# Patient Record
Sex: Male | Born: 1960 | Race: White | Hispanic: No | Marital: Married | Smoking: Never smoker
Health system: Southern US, Community
[De-identification: ages and names within clinical notes are randomized; demographics above are authoritative.]

---

## 2011-06-24 ENCOUNTER — Emergency Department: Payer: Self-pay | Admitting: Internal Medicine

## 2017-07-25 ENCOUNTER — Emergency Department: Payer: No Typology Code available for payment source

## 2017-07-25 ENCOUNTER — Emergency Department
Admission: EM | Admit: 2017-07-25 | Discharge: 2017-07-25 | Disposition: A | Discharge status: Home / Self Care | Payer: No Typology Code available for payment source | Attending: Emergency Medicine | Admitting: Emergency Medicine

## 2017-07-25 DIAGNOSIS — S161XXA Strain of muscle, fascia and tendon at neck level, initial encounter: Secondary | ICD-10-CM | POA: Diagnosis not present

## 2017-07-25 DIAGNOSIS — S199XXA Unspecified injury of neck, initial encounter: Secondary | ICD-10-CM | POA: Diagnosis present

## 2017-07-25 DIAGNOSIS — Y939 Activity, unspecified: Secondary | ICD-10-CM | POA: Insufficient documentation

## 2017-07-25 DIAGNOSIS — Y92410 Unspecified street and highway as the place of occurrence of the external cause: Secondary | ICD-10-CM | POA: Diagnosis not present

## 2017-07-25 DIAGNOSIS — Y998 Other external cause status: Secondary | ICD-10-CM | POA: Diagnosis not present

## 2017-07-25 DIAGNOSIS — G44319 Acute post-traumatic headache, not intractable: Secondary | ICD-10-CM | POA: Insufficient documentation

## 2017-07-25 DIAGNOSIS — S39012A Strain of muscle, fascia and tendon of lower back, initial encounter: Secondary | ICD-10-CM | POA: Insufficient documentation

## 2017-07-25 MED ORDER — HYDROCODONE-ACETAMINOPHEN 5-325 MG PO TABS: 1.0000 | ORAL_TABLET | Freq: Four times a day (QID) | ORAL | 0 refills | Status: DC | PRN

## 2017-07-25 MED ORDER — HYDROCODONE-ACETAMINOPHEN 5-325 MG PO TABS
2.0000 | ORAL_TABLET | Freq: Once | ORAL | Status: AC
  Administered 2017-07-25: 2 via ORAL
  Filled 2017-07-25: qty 2

## 2017-07-25 NOTE — ED Notes (Signed)
See triage note.

## 2017-07-25 NOTE — ED Notes (Signed)
Reviewed d/c instructions, follow-up care, prescription, use of ice/elevation/heat with patient. Pt verbalized understanding.
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
## 2017-07-25 NOTE — Discharge Instructions (Signed)
Follow-up with your primary care provider or [facility] if any continued problems. Ice or heat to your muscles as needed for discomfort. Take Norco as directed every 6 hours as needed for pain. Do not drive or operate machinery while taking this medication as it could cause drowsiness.

## 2017-08-13 ENCOUNTER — Encounter: Payer: Self-pay | Admitting: Emergency Medicine

## 2017-08-13 ENCOUNTER — Emergency Department
Admission: EM | Admit: 2017-08-13 | Discharge: 2017-08-13 | Disposition: A | Discharge status: Home / Self Care | Payer: No Typology Code available for payment source | Attending: Emergency Medicine | Admitting: Emergency Medicine

## 2017-08-13 DIAGNOSIS — L02416 Cutaneous abscess of left lower limb: Secondary | ICD-10-CM | POA: Diagnosis not present

## 2017-08-13 DIAGNOSIS — M79605 Pain in left leg: Secondary | ICD-10-CM | POA: Diagnosis present

## 2017-08-13 MED ORDER — SULFAMETHOXAZOLE-TRIMETHOPRIM 800-160 MG PO TABS: 1.0000 | ORAL_TABLET | Freq: Two times a day (BID) | ORAL | 0 refills | Status: AC

## 2017-08-13 MED ORDER — HYDROCODONE-ACETAMINOPHEN 5-325 MG PO TABS: 1.0000 | ORAL_TABLET | Freq: Four times a day (QID) | ORAL | 0 refills | Status: AC | PRN
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
## 2017-08-13 NOTE — ED Provider Notes (Signed)
[facility] EMERGENCY DEPARTMENT Provider Note   CSN: [id number] Arrival date & time: 08/13/17  1619     History   Chief Complaint Chief Complaint  Patient presents with  . Leg Injury  . Abscess    HPI Chase Solomon is a 56 y.o. male presents to the emergency department for evaluation of left leg pain.  Developed a boil to the left mid inner thigh on 07/27/2017.  He denies any insect bites.  Patient states there was significant redness and pain that developed.  Redness was the size of his hand.  Over the last couple of days redness has resolved, central area of hardness began to drain purulent material and he decided to come into the emergency department for recheck.  He has not been taking any antibiotics.  His pain is currently 5 out of 10.  He denies any fevers.  No swelling throughout the thigh or lower leg.  He is ambulatory with no limp.  HPI  History reviewed. No pertinent past medical history.  There are no active problems to display for this patient.   History reviewed. No pertinent surgical history.     Home Medications    Prior to Admission medications   Medication Sig Start Date End Date Taking? Authorizing Provider  HYDROcodone-acetaminophen (NORCO) 5-325 MG tablet Take 1 tablet by mouth every 6 (six) hours as needed for moderate pain. 08/13/17   Evon SlackGaines, Iley Deignan C, PA-C  sulfamethoxazole-trimethoprim (BACTRIM DS,SEPTRA DS) 800-160 MG tablet Take 1 tablet by mouth 2 (two) times daily. 08/13/17 08/23/17  Evon SlackGaines, Jalin Alicea C, PA-C    Family History No family history on file.  Social History Social History  Substance Use Topics  . Smoking status: Never Smoker  . Smokeless tobacco: Never Used  . Alcohol use Yes     Comment: occ     Allergies   Patient has no known allergies.   Review of Systems Review of Systems  Constitutional: Negative for fever.  Respiratory: Negative for shortness of breath.   Cardiovascular: Negative for  chest pain.  Gastrointestinal: Negative for abdominal pain.  Genitourinary: Negative for difficulty urinating, dysuria and urgency.  Musculoskeletal: Negative for back pain and myalgias.  Skin: Positive for wound. Negative for rash.  Neurological: Negative for dizziness and headaches.     Physical Exam Updated Vital Signs BP (!) 177/94 (BP Location: Left Arm)   Pulse 64   Temp 98.9 F (37.2 C) (Oral)   Resp 17   Ht 5\' 6"  (1.676 m)   Wt 90.7 kg (200 lb)   SpO2 98%   BMI 32.28 kg/m   Physical Exam  Constitutional: He is oriented to person, place, and time. He appears well-developed and well-nourished.  HENT:  Head: Normocephalic and atraumatic.  Eyes: Conjunctivae are normal.  Neck: Normal range of motion.  Cardiovascular: Normal rate.   Pulmonary/Chest: Effort normal. No respiratory distress.  Musculoskeletal: Normal range of motion.  Neurological: He is alert and oriented to person, place, and time.  Skin: Skin is warm. No rash noted.  Evaluation of the left lower extremity shows no swelling or edema.  He has a negative Homans sign.  The mid inner aspect of his left thigh there is a 3 cm in diameter circumferential area of skin breakdown with mild ulceration.  There is 2-3 mm of ulceration along the wound.  Within the wound there is purulent yellow discharge.  This purulent yellow discharge is removed.  Mild drainage is present with compression.  There is no necrotic tissue present.  There is no surrounding erythema.  No fluctuance or induration noted at this time.  Psychiatric: He has a normal mood and affect. His behavior is normal. Thought content normal.     ED Treatments / Results  Labs (all labs ordered are listed, but only abnormal results are displayed) Labs Reviewed - No data to display  EKG  EKG Interpretation None       Radiology No results found.  Procedures Procedures (including critical care time) Left leg abscess is deroofed and purulent material  removed.  Medications Ordered in ED Medications - No data to display   Initial Impression / Assessment and Plan / ED Course  I have reviewed the triage vital signs and the nursing notes.  Pertinent labs & imaging results that were available during my care of the patient were reviewed by me and considered in my medical decision making (see chart for details).     56 year old male with left leg abscess, purulent with drainage.  Wound was deroofed to allow for better drainage.  There was no fluctuance and no incision and drainage was performed.  Based on patient's description, this wound has significantly improved as far as the surrounding erythema and swelling.  Will place patient on antibiotics.  He is educated on signs and symptoms to return to the ED for.  Final Clinical Impressions(s) / ED Diagnoses   Final diagnoses:  Abscess of left leg    New Prescriptions New Prescriptions   HYDROCODONE-ACETAMINOPHEN (NORCO) 5-325 MG TABLET    Take 1 tablet by mouth every 6 (six) hours as needed for moderate pain.   SULFAMETHOXAZOLE-TRIMETHOPRIM (BACTRIM DS,SEPTRA DS) 800-160 MG TABLET    Take 1 tablet by mouth 2 (two) times daily.     Evon Slack, PA-C 08/13/17 1741    Merrily Brittle, MD 08/13/17 [phone]

## 2017-08-13 NOTE — Discharge Instructions (Signed)
Please continue with warm compresses.  Take antibiotics as prescribed.  If any increasing pain, redness, fevers, return to the emergency department.
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
